# Patient Record
Sex: Female | Born: 1980 | Race: Black or African American | Hispanic: No | Marital: Single | Smoking: Former smoker
Health system: Southern US, Community
[De-identification: ages and names within clinical notes are randomized; demographics above are authoritative.]

## PROBLEM LIST (undated history)

## (undated) ENCOUNTER — Inpatient Hospital Stay (HOSPITAL_COMMUNITY): Payer: Self-pay

## (undated) DIAGNOSIS — J45909 Unspecified asthma, uncomplicated: Secondary | ICD-10-CM

## (undated) DIAGNOSIS — Z789 Other specified health status: Secondary | ICD-10-CM

## (undated) HISTORY — PX: WISDOM TOOTH EXTRACTION: SHX21

---
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
## 1999-07-19 ENCOUNTER — Emergency Department (HOSPITAL_COMMUNITY): Admission: EM | Admit: 1999-07-19 | Discharge: 1999-07-19 | Payer: Self-pay | Admitting: Emergency Medicine

## 2000-06-28 ENCOUNTER — Emergency Department (HOSPITAL_COMMUNITY): Admission: EM | Admit: 2000-06-28 | Discharge: 2000-06-28 | Payer: Self-pay | Admitting: Emergency Medicine

## 2001-02-22 ENCOUNTER — Emergency Department (HOSPITAL_COMMUNITY): Admission: EM | Admit: 2001-02-22 | Discharge: 2001-02-22 | Payer: Self-pay

## 2001-08-28 ENCOUNTER — Emergency Department (HOSPITAL_COMMUNITY): Admission: EM | Admit: 2001-08-28 | Discharge: 2001-08-28 | Payer: Self-pay

## 2002-01-06 ENCOUNTER — Inpatient Hospital Stay (HOSPITAL_COMMUNITY): Admission: AD | Admit: 2002-01-06 | Discharge: 2002-01-06 | Payer: Self-pay | Admitting: *Deleted

## 2002-01-08 ENCOUNTER — Inpatient Hospital Stay (HOSPITAL_COMMUNITY): Admission: AD | Admit: 2002-01-08 | Discharge: 2002-01-08 | Payer: Self-pay | Admitting: Obstetrics and Gynecology

## 2004-05-15 ENCOUNTER — Inpatient Hospital Stay (HOSPITAL_COMMUNITY): Admission: AD | Admit: 2004-05-15 | Discharge: 2004-05-15 | Payer: Self-pay | Admitting: *Deleted

## 2005-12-26 ENCOUNTER — Inpatient Hospital Stay (HOSPITAL_COMMUNITY): Admission: AD | Admit: 2005-12-26 | Discharge: 2005-12-26 | Payer: Self-pay | Admitting: Obstetrics and Gynecology

## 2006-08-26 ENCOUNTER — Inpatient Hospital Stay (HOSPITAL_COMMUNITY): Admission: AD | Admit: 2006-08-26 | Discharge: 2006-08-27 | Payer: Self-pay | Admitting: Gynecology

## 2006-12-19 ENCOUNTER — Inpatient Hospital Stay (HOSPITAL_COMMUNITY): Admission: AD | Admit: 2006-12-19 | Discharge: 2006-12-19 | Payer: Self-pay | Admitting: Family Medicine

## 2007-03-18 ENCOUNTER — Inpatient Hospital Stay (HOSPITAL_COMMUNITY): Admission: AD | Admit: 2007-03-18 | Discharge: 2007-03-18 | Payer: Self-pay | Admitting: Gynecology

## 2007-09-03 ENCOUNTER — Emergency Department (HOSPITAL_COMMUNITY): Admission: EM | Admit: 2007-09-03 | Discharge: 2007-09-03 | Payer: Self-pay | Admitting: Emergency Medicine

## 2007-09-13 ENCOUNTER — Emergency Department (HOSPITAL_COMMUNITY): Admission: EM | Admit: 2007-09-13 | Discharge: 2007-09-13 | Payer: Self-pay | Admitting: Emergency Medicine

## 2007-10-02 ENCOUNTER — Inpatient Hospital Stay (HOSPITAL_COMMUNITY): Admission: AD | Admit: 2007-10-02 | Discharge: 2007-10-02 | Payer: Self-pay | Admitting: Obstetrics & Gynecology

## 2007-12-12 ENCOUNTER — Encounter: Admission: RE | Admit: 2007-12-12 | Discharge: 2007-12-12 | Payer: Self-pay | Admitting: Chiropractic Medicine

## 2008-09-07 ENCOUNTER — Inpatient Hospital Stay (HOSPITAL_COMMUNITY): Admission: AD | Admit: 2008-09-07 | Discharge: 2008-09-07 | Payer: Self-pay | Admitting: Obstetrics & Gynecology

## 2008-10-26 ENCOUNTER — Emergency Department (HOSPITAL_COMMUNITY): Admission: EM | Admit: 2008-10-26 | Discharge: 2008-10-26 | Payer: Self-pay | Admitting: Emergency Medicine

## 2009-04-16 ENCOUNTER — Emergency Department (HOSPITAL_COMMUNITY): Admission: EM | Admit: 2009-04-16 | Discharge: 2009-04-16 | Payer: Self-pay | Admitting: Emergency Medicine

## 2009-04-16 ENCOUNTER — Inpatient Hospital Stay (HOSPITAL_COMMUNITY): Admission: AD | Admit: 2009-04-16 | Discharge: 2009-04-16 | Payer: Self-pay | Admitting: Obstetrics and Gynecology

## 2009-04-16 ENCOUNTER — Ambulatory Visit: Payer: Self-pay | Admitting: Obstetrics and Gynecology

## 2009-07-05 ENCOUNTER — Inpatient Hospital Stay (HOSPITAL_COMMUNITY): Admission: AD | Admit: 2009-07-05 | Discharge: 2009-07-05 | Payer: Self-pay | Admitting: Obstetrics and Gynecology

## 2010-09-05 ENCOUNTER — Inpatient Hospital Stay (HOSPITAL_COMMUNITY)
Admission: AD | Admit: 2010-09-05 | Discharge: 2010-09-06 | Disposition: A | Discharge status: Home / Self Care | Payer: Self-pay | Source: Ambulatory Visit | Attending: Obstetrics & Gynecology | Admitting: Obstetrics & Gynecology

## 2010-09-05 DIAGNOSIS — N949 Unspecified condition associated with female genital organs and menstrual cycle: Secondary | ICD-10-CM

## 2010-09-05 DIAGNOSIS — R1032 Left lower quadrant pain: Secondary | ICD-10-CM

## 2010-09-05 DIAGNOSIS — N390 Urinary tract infection, site not specified: Secondary | ICD-10-CM

## 2010-09-05 DIAGNOSIS — R102 Pelvic and perineal pain: Secondary | ICD-10-CM

## 2010-09-06 ENCOUNTER — Inpatient Hospital Stay (HOSPITAL_COMMUNITY): Payer: Self-pay

## 2010-09-06 LAB — WET PREP, GENITAL
Trich, Wet Prep: NONE SEEN
Yeast Wet Prep HPF POC: NONE SEEN

## 2010-09-06 LAB — URINALYSIS, ROUTINE W REFLEX MICROSCOPIC
Hgb urine dipstick: NEGATIVE
Ketones, ur: 15 mg/dL — AB
Nitrite: NEGATIVE
pH: 5.5 (ref 5.0–8.0)

## 2010-09-06 LAB — GC/CHLAMYDIA PROBE AMP, GENITAL: Chlamydia, DNA Probe: NEGATIVE

## 2010-09-06 LAB — POCT PREGNANCY, URINE: Preg Test, Ur: NEGATIVE

## 2010-09-06 LAB — URINE MICROSCOPIC-ADD ON

## 2010-09-08 LAB — URINE CULTURE: Colony Count: 2000

## 2010-09-18 LAB — WET PREP, GENITAL
Clue Cells Wet Prep HPF POC: NONE SEEN
Trich, Wet Prep: NONE SEEN

## 2010-09-18 LAB — URINALYSIS, ROUTINE W REFLEX MICROSCOPIC
Ketones, ur: NEGATIVE mg/dL
Leukocytes, UA: NEGATIVE
Nitrite: NEGATIVE
Protein, ur: NEGATIVE mg/dL
Urobilinogen, UA: 0.2 mg/dL (ref 0.0–1.0)

## 2010-09-18 LAB — URINE MICROSCOPIC-ADD ON

## 2010-10-06 LAB — BASIC METABOLIC PANEL
Calcium: 9.4 mg/dL (ref 8.4–10.5)
Creatinine, Ser: 0.76 mg/dL (ref 0.4–1.2)
GFR calc Af Amer: >60 mL/min (ref >60)
GFR calc non Af Amer: >60 mL/min (ref >60)
Sodium: 137 mEq/L (ref 135–145)

## 2010-10-12 LAB — POCT I-STAT, CHEM 8
Creatinine, Ser: 0.9 mg/dL (ref 0.4–1.2)
Glucose, Bld: 85 mg/dL (ref 70–99)
HCT: 47 % — ABNORMAL HIGH (ref 36.0–46.0)
Hemoglobin: 16 g/dL — ABNORMAL HIGH (ref 12.0–15.0)
Potassium: 4.1 mEq/L (ref 3.5–5.1)
Sodium: 139 mEq/L (ref 135–145)
TCO2: 24 mmol/L (ref 0–100)

## 2010-10-13 LAB — URINALYSIS, ROUTINE W REFLEX MICROSCOPIC
Bilirubin Urine: NEGATIVE
Glucose, UA: NEGATIVE mg/dL
Hgb urine dipstick: NEGATIVE
Ketones, ur: NEGATIVE mg/dL
Specific Gravity, Urine: 1.01 (ref 1.005–1.030)
pH: 6 (ref 5.0–8.0)

## 2010-10-13 LAB — WET PREP, GENITAL: Yeast Wet Prep HPF POC: NONE SEEN

## 2010-10-13 LAB — GC/CHLAMYDIA PROBE AMP, GENITAL: Chlamydia, DNA Probe: NEGATIVE

## 2010-10-19 IMAGING — US US TRANSVAGINAL NON-OB
1 series · 14 of 25 positions shown · non-contrast
Comparison: 12/19/2006.

CLINICAL DATA: Left lower quadrant pain.  Vaginal discharge.

TRANSABDOMINAL AND TRANSVAGINAL ULTRASOUND OF PELVIS
TECHNIQUE: Both transabdominal and transvaginal ultrasound
examinations of the pelvis were performed including evaluation of
the uterus, ovaries, adnexal regions, and pelvic cul-de-sac.

[Series 1: us transvaginal non-ob · 14 of 48 slices shown]
[im 1/48]
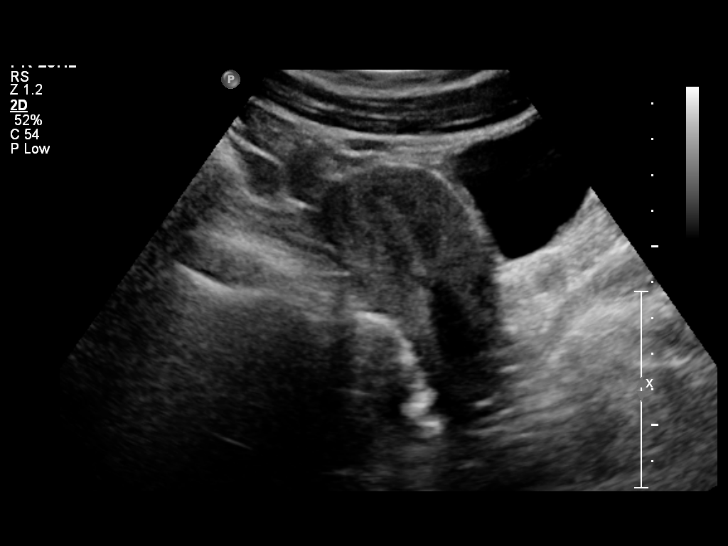
[im 4/48]
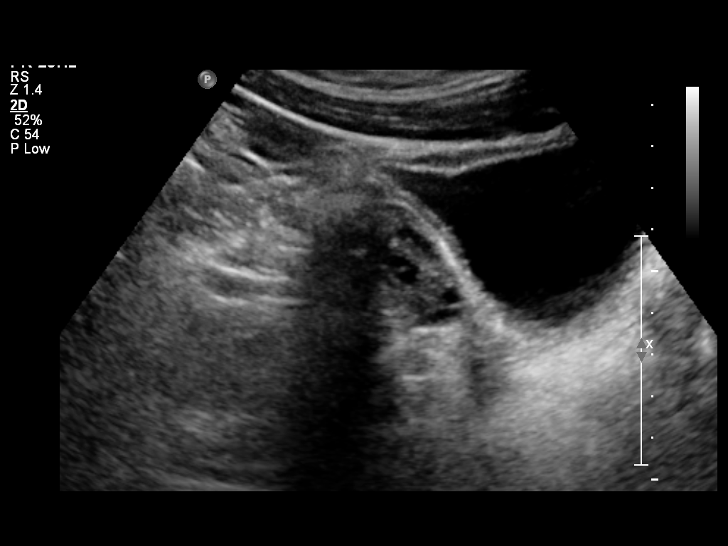
[im 8/48]
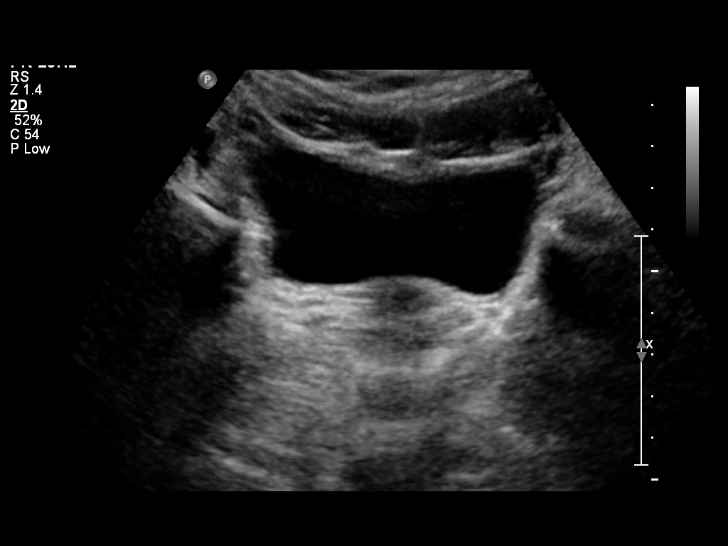
[im 12/48]
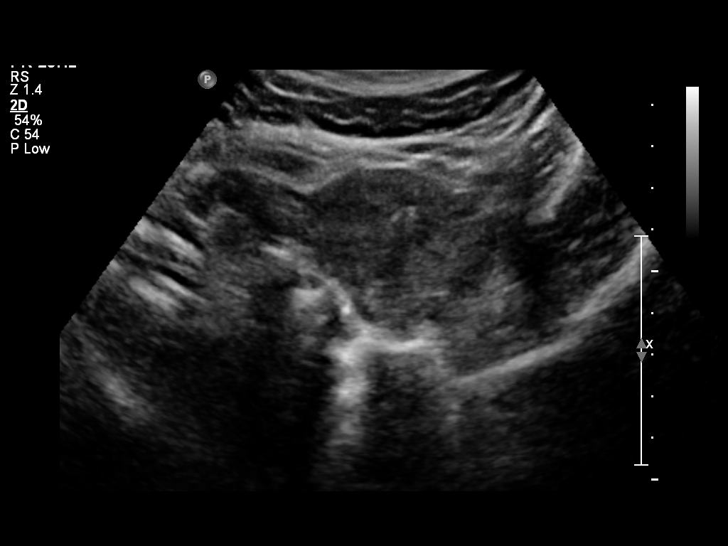
[im 16/48]
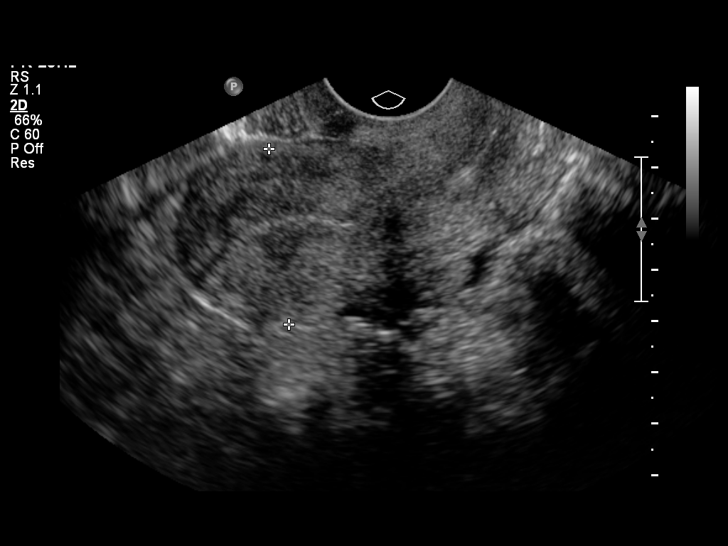
[im 18/48]
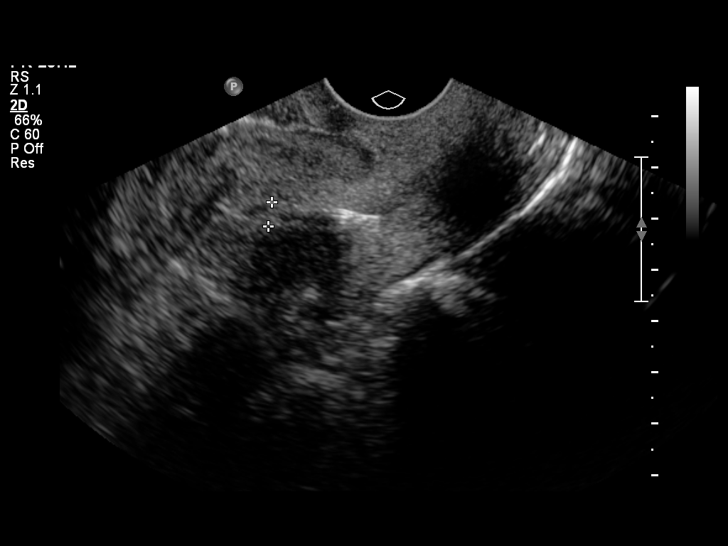
[im 22/48]
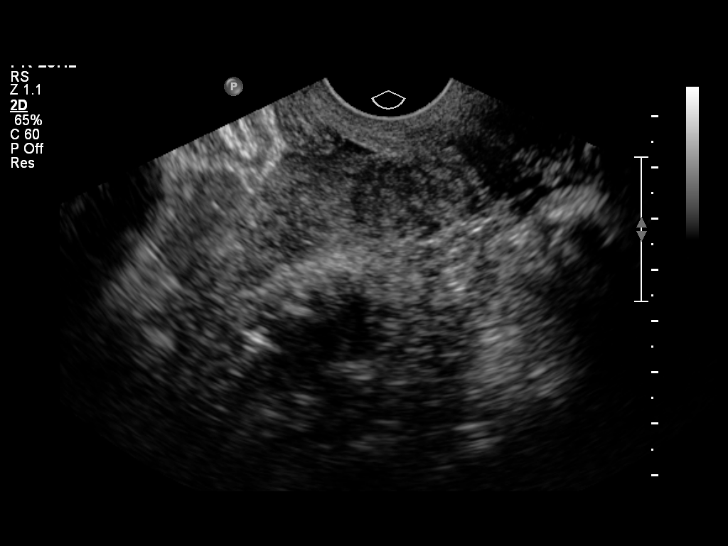
[im 26/48]
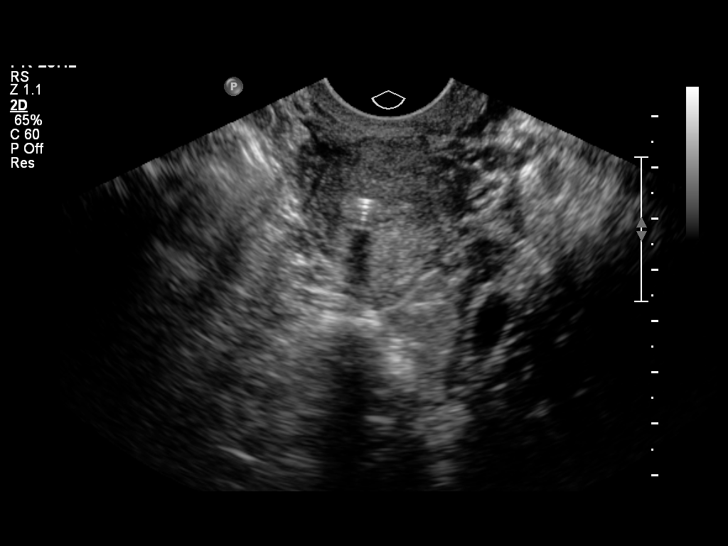
[im 30/48]
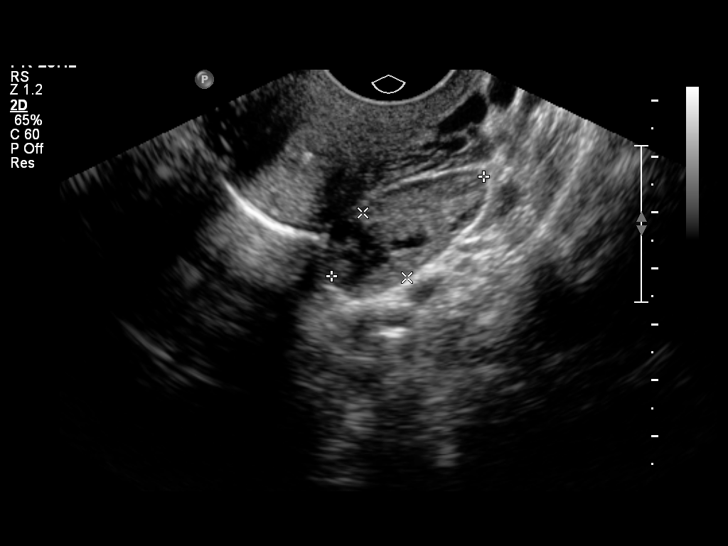
[im 32/48]
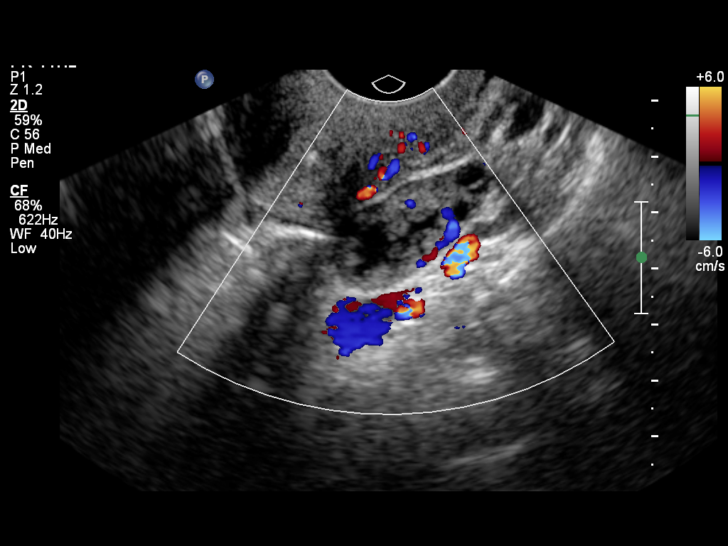
[im 36/48]
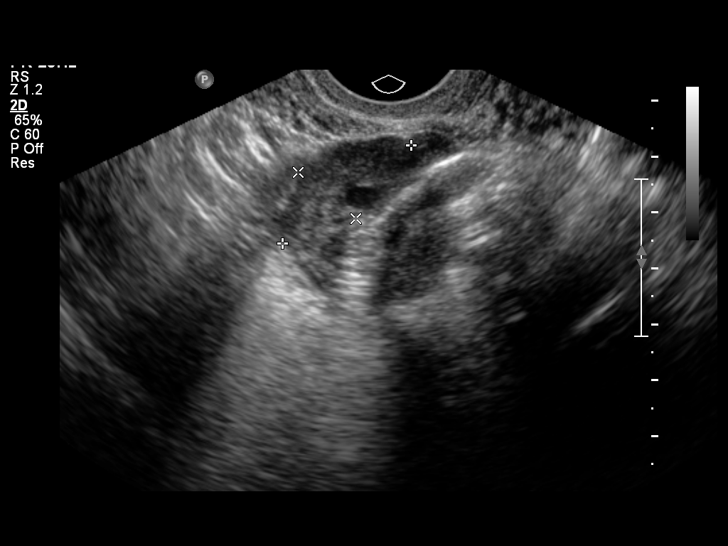
[im 40/48]
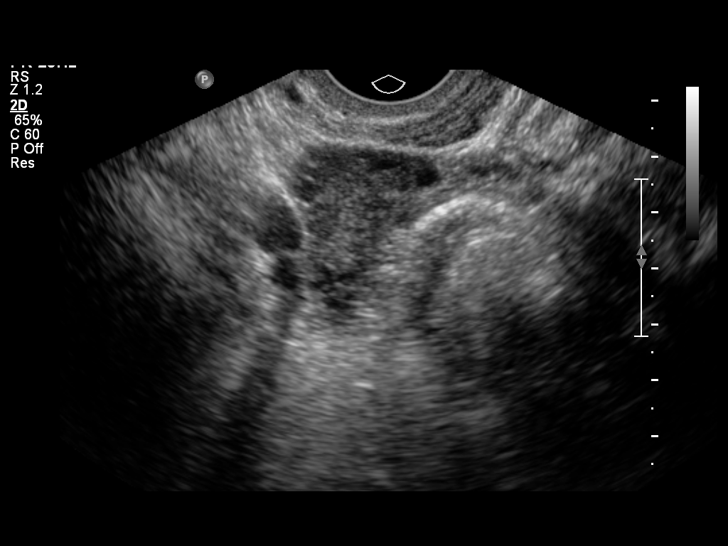
[im 44/48]
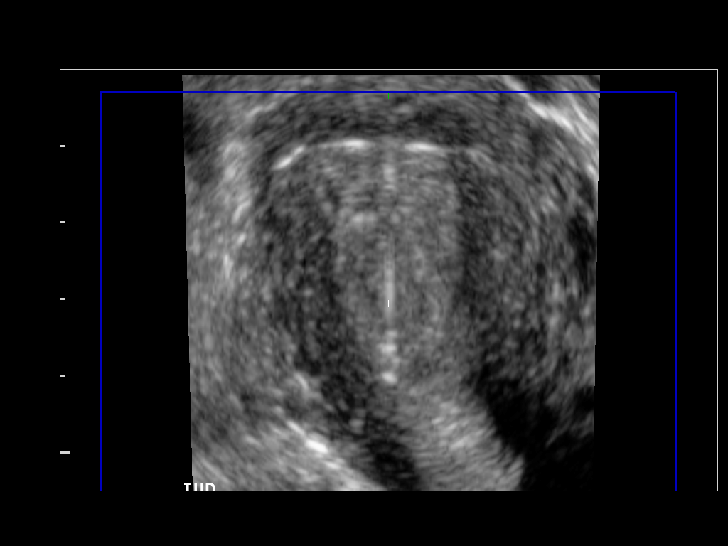
[im 48/48]
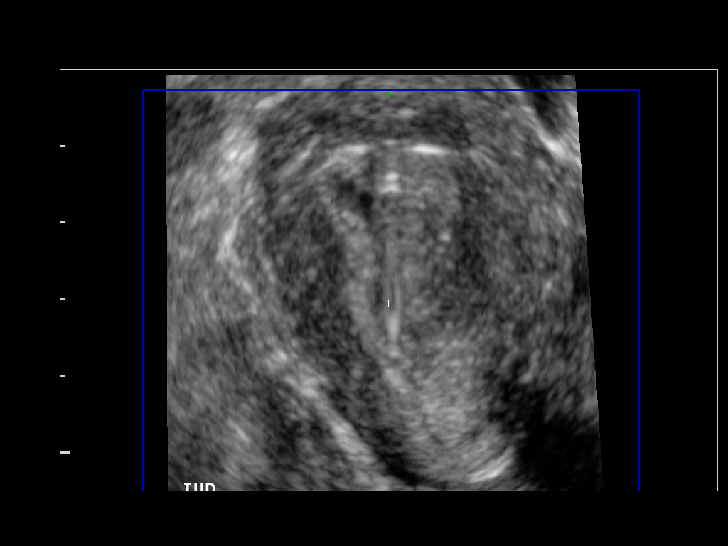

[14 of 25 positions shown; findings below may reference images not displayed]

FINDINGS: Uterus:  Normal in size at 7.8 x 3.4 x 4.1 cm.

     Endometrium:  Normal in thickness at 5 mm.  Intrauterine
device appropriately positioned.

     Right Ovary:  2.9 x 1.3 x 1.5 cm.  Normal in morphology.

     Left Ovary:  3.2 x 1.4 x 1.7 cm.  1.2 cm irregular hypoechoic
lesion within is likely a corpus luteal cyst / involuting follicle.

No significant free fluid.
IMPRESSION: 1.  Appropriate position of intrauterine device.
2.  Left ovarian corpus luteal cyst / involuting follicle.

## 2011-01-13 ENCOUNTER — Emergency Department (HOSPITAL_COMMUNITY)
Admission: EM | Admit: 2011-01-13 | Discharge: 2011-01-13 | Disposition: A | Discharge status: Home / Self Care | Payer: Self-pay | Attending: Emergency Medicine | Admitting: Emergency Medicine

## 2011-01-13 DIAGNOSIS — J3489 Other specified disorders of nose and nasal sinuses: Secondary | ICD-10-CM | POA: Insufficient documentation

## 2011-01-13 DIAGNOSIS — R6883 Chills (without fever): Secondary | ICD-10-CM | POA: Insufficient documentation

## 2011-01-13 DIAGNOSIS — H6690 Otitis media, unspecified, unspecified ear: Secondary | ICD-10-CM | POA: Insufficient documentation

## 2011-01-13 DIAGNOSIS — J45909 Unspecified asthma, uncomplicated: Secondary | ICD-10-CM | POA: Insufficient documentation

## 2011-01-13 DIAGNOSIS — H9209 Otalgia, unspecified ear: Secondary | ICD-10-CM | POA: Insufficient documentation

## 2011-01-13 DIAGNOSIS — I10 Essential (primary) hypertension: Secondary | ICD-10-CM | POA: Insufficient documentation

## 2011-03-28 LAB — URINALYSIS, ROUTINE W REFLEX MICROSCOPIC
Hgb urine dipstick: NEGATIVE
Nitrite: NEGATIVE
Protein, ur: NEGATIVE
Specific Gravity, Urine: 1.015
Urobilinogen, UA: 0.2

## 2011-03-28 LAB — POCT PREGNANCY, URINE
Operator id: 12087
Preg Test, Ur: NEGATIVE

## 2011-03-28 LAB — GC/CHLAMYDIA PROBE AMP, GENITAL
Chlamydia, DNA Probe: NEGATIVE
GC Probe Amp, Genital: NEGATIVE

## 2011-03-28 LAB — WET PREP, GENITAL: Clue Cells Wet Prep HPF POC: NONE SEEN

## 2011-04-13 LAB — URINALYSIS, ROUTINE W REFLEX MICROSCOPIC
Bilirubin Urine: NEGATIVE
Hgb urine dipstick: NEGATIVE
Nitrite: NEGATIVE
Specific Gravity, Urine: 1.025
Urobilinogen, UA: 0.2
pH: 6

## 2011-04-13 LAB — GC/CHLAMYDIA PROBE AMP, GENITAL: GC Probe Amp, Genital: NEGATIVE

## 2011-04-13 LAB — POCT PREGNANCY, URINE
Operator id: 120561
Preg Test, Ur: NEGATIVE

## 2011-04-13 LAB — CBC
HCT: 42.4
MCHC: 34.1
MCV: 90.7
RBC: 4.67
WBC: 9.9

## 2011-04-19 LAB — URINALYSIS, ROUTINE W REFLEX MICROSCOPIC
Bilirubin Urine: NEGATIVE
Glucose, UA: NEGATIVE
Hgb urine dipstick: NEGATIVE
Ketones, ur: NEGATIVE
Protein, ur: NEGATIVE
pH: 6

## 2011-04-19 LAB — WET PREP, GENITAL
Clue Cells Wet Prep HPF POC: NONE SEEN
Trich, Wet Prep: NONE SEEN
Yeast Wet Prep HPF POC: NONE SEEN

## 2011-04-19 LAB — GC/CHLAMYDIA PROBE AMP, GENITAL
Chlamydia, DNA Probe: NEGATIVE
GC Probe Amp, Genital: NEGATIVE

## 2016-01-09 ENCOUNTER — Encounter (HOSPITAL_COMMUNITY): Payer: Self-pay | Admitting: *Deleted

## 2016-01-09 ENCOUNTER — Inpatient Hospital Stay (HOSPITAL_COMMUNITY)
Admission: AD | Admit: 2016-01-09 | Discharge: 2016-01-09 | Disposition: A | Discharge status: Home / Self Care | Payer: Medicaid Other | Source: Ambulatory Visit | Attending: Obstetrics & Gynecology | Admitting: Obstetrics & Gynecology

## 2016-01-09 ENCOUNTER — Inpatient Hospital Stay (HOSPITAL_COMMUNITY): Payer: Medicaid Other

## 2016-01-09 DIAGNOSIS — Z87891 Personal history of nicotine dependence: Secondary | ICD-10-CM | POA: Diagnosis not present

## 2016-01-09 DIAGNOSIS — Z880 Allergy status to penicillin: Secondary | ICD-10-CM | POA: Insufficient documentation

## 2016-01-09 DIAGNOSIS — O9989 Other specified diseases and conditions complicating pregnancy, childbirth and the puerperium: Secondary | ICD-10-CM

## 2016-01-09 DIAGNOSIS — R109 Unspecified abdominal pain: Secondary | ICD-10-CM | POA: Diagnosis not present

## 2016-01-09 DIAGNOSIS — O26891 Other specified pregnancy related conditions, first trimester: Secondary | ICD-10-CM | POA: Diagnosis not present

## 2016-01-09 DIAGNOSIS — O3680X0 Pregnancy with inconclusive fetal viability, not applicable or unspecified: Secondary | ICD-10-CM

## 2016-01-09 DIAGNOSIS — O26899 Other specified pregnancy related conditions, unspecified trimester: Secondary | ICD-10-CM

## 2016-01-09 DIAGNOSIS — Z3A01 Less than 8 weeks gestation of pregnancy: Secondary | ICD-10-CM | POA: Diagnosis not present

## 2016-01-09 HISTORY — DX: Other specified health status: Z78.9

## 2016-01-09 LAB — CBC
HEMATOCRIT: 39 % (ref 36.0–46.0)
Hemoglobin: 13 g/dL (ref 12.0–15.0)
MCH: 29 pg (ref 26.0–34.0)
MCHC: 33.3 g/dL (ref 30.0–36.0)
MCV: 87.1 fL (ref 78.0–100.0)
PLATELETS: 326 10*3/uL (ref 150–400)
RBC: 4.48 MIL/uL (ref 3.87–5.11)
RDW: 14.3 % (ref 11.5–15.5)
WBC: 8.6 10*3/uL (ref 4.0–10.5)

## 2016-01-09 LAB — URINALYSIS, ROUTINE W REFLEX MICROSCOPIC
BILIRUBIN URINE: NEGATIVE
Glucose, UA: NEGATIVE mg/dL
HGB URINE DIPSTICK: NEGATIVE
KETONES UR: NEGATIVE mg/dL
Leukocytes, UA: NEGATIVE
Nitrite: NEGATIVE
PROTEIN: NEGATIVE mg/dL
SPECIFIC GRAVITY, URINE: 1.02 (ref 1.005–1.030)
pH: 7.5 (ref 5.0–8.0)

## 2016-01-09 LAB — HCG, QUANTITATIVE, PREGNANCY: HCG, BETA CHAIN, QUANT, S: 1820 m[IU]/mL — AB (ref <5)

## 2016-01-09 LAB — ABO/RH: ABO/RH(D): AB POS

## 2016-01-09 LAB — POCT PREGNANCY, URINE: PREG TEST UR: POSITIVE — AB

## 2016-01-09 NOTE — MAU Provider Note (Signed)
History     CSN: [id number]  Arrival date and time: 01/09/16 1313   First Provider Initiated Contact with Patient 01/09/16 1414      Chief Complaint  Patient presents with  . Abdominal Pain   HPI   Ms.Kristina Santiago is 35 y.o. female 763P1010 @ 569w4d presenting to MAU with abdominal pain. She did not know she was pregnant. The pain started 1-2 weeks ago, "the pain had me balled up". She tried tylenol which helped some. She is not currently having pain,  but did feel the off and on "pinches" this morning. The pain comes every day, however the pain comes and goes.   She denies vaginal bleeding.  This is a desired pregnancy.   OB History    Gravida Para Term Preterm AB TAB SAB Ectopic Multiple Living   3 1 1  1  1          Past Medical History  Diagnosis Date  . Medical history non-contributory     Past Surgical History  Procedure Laterality Date  . No past surgeries      History reviewed. No pertinent family history.  Social History  Substance Use Topics  . Smoking status: Former Games developermoker  . Smokeless tobacco: None  . Alcohol Use: Yes    Allergies:  Allergies  Allergen Reactions  . Penicillins Anaphylaxis    Has patient had a PCN reaction causing immediate rash, facial/tongue/throat swelling, SOB or lightheadedness with hypotension: Yes Has patient had a PCN reaction causing severe rash involving mucus membranes or skin necrosis: No Has patient had a PCN reaction that required hospitalization No Has patient had a PCN reaction occurring within the last 10 years: No If all of the above answers are "NO", then may proceed with Cephalosporin use.     Prescriptions prior to admission  Medication Sig Dispense Refill Last Dose  . acetaminophen (TYLENOL) 325 MG tablet Take 650 mg by mouth every 6 (six) hours as needed for moderate pain.   Past Week at Unknown time  . Multiple Vitamins-Minerals (HAIR SKIN NAILS PO) Take 1 tablet by mouth daily.      Results for  orders placed or performed during the hospital encounter of 01/09/16 (from the past 48 hour(s))  Urinalysis, Routine w reflex microscopic (not at [facility])     Status: None   Collection Time: 01/09/16  1:30 PM  Result Value Ref Range   Color, Urine YELLOW YELLOW   APPearance CLEAR CLEAR   Specific Gravity, Urine 1.020 1.005 - 1.030   pH 7.5 5.0 - 8.0   Glucose, UA NEGATIVE NEGATIVE mg/dL   Hgb urine dipstick NEGATIVE NEGATIVE   Bilirubin Urine NEGATIVE NEGATIVE   Ketones, ur NEGATIVE NEGATIVE mg/dL   Protein, ur NEGATIVE NEGATIVE mg/dL   Nitrite NEGATIVE NEGATIVE   Leukocytes, UA NEGATIVE NEGATIVE    Comment: MICROSCOPIC NOT DONE ON URINES WITH NEGATIVE PROTEIN, BLOOD, LEUKOCYTES, NITRITE, OR GLUCOSE <1000 mg/dL.  Pregnancy, urine POC     Status: Abnormal   Collection Time: 01/09/16  1:36 PM  Result Value Ref Range   Preg Test, Ur POSITIVE (A) NEGATIVE    Comment:        THE SENSITIVITY OF THIS METHODOLOGY IS >24 mIU/mL   CBC     Status: None   Collection Time: 01/09/16  2:43 PM  Result Value Ref Range   WBC 8.6 4.0 - 10.5 K/uL   RBC 4.48 3.87 - 5.11 MIL/uL   Hemoglobin 13.0 12.0 - 15.0  g/dL   HCT 16.1 09.6 - 04.5 %   MCV 87.1 78.0 - 100.0 fL   MCH 29.0 26.0 - 34.0 pg   MCHC 33.3 30.0 - 36.0 g/dL   RDW 40.9 81.1 - 91.4 %   Platelets 326 150 - 400 K/uL  ABO/Rh     Status: None (Preliminary result)   Collection Time: 01/09/16  2:43 PM  Result Value Ref Range   ABO/RH(D) AB POS   hCG, quantitative, pregnancy     Status: Abnormal   Collection Time: 01/09/16  2:43 PM  Result Value Ref Range   hCG, Beta Chain, Quant, S 1820 (H) <5 mIU/mL    Comment:          GEST. AGE      CONC.  (mIU/mL)   <=1 WEEK        5 - 50     2 WEEKS       50 - 500     3 WEEKS       100 - 10,000     4 WEEKS     1,000 - 30,000     5 WEEKS     3,500 - 115,000   6-8 WEEKS     12,000 - 270,000    12 WEEKS     15,000 - 220,000        FEMALE AND NON-PREGNANT FEMALE:     LESS THAN 5 mIU/mL     Review  of Systems  Constitutional: Negative for fever and chills.  Genitourinary: Negative for dysuria.       Denies abnormal vaginal discharge.    Physical Exam   Blood pressure 131/86, pulse 89, temperature 98.1 F (36.7 C), temperature source Oral, resp. rate 18, height 5' (1.524 m), weight 150 lb 3.2 oz (68.13 kg), last menstrual period 12/08/2015.  Physical Exam  Constitutional: She is oriented to person, place, and time. She appears well-developed and well-nourished. No distress.  HENT:  Head: Normocephalic.  Eyes: Pupils are equal, round, and reactive to light.  Neck: Neck supple.  GI: Soft. She exhibits no distension and no mass. There is no tenderness. There is no rebound and no guarding.  Genitourinary:  Bimanual exam: Cervix closed Uterus non tender, normal size Adnexa non tender, no masses bilaterally Chaperone present for exam.  Musculoskeletal: Normal range of motion.  Neurological: She is alert and oriented to person, place, and time.  Skin: Skin is warm. She is not diaphoretic.  Psychiatric: Her behavior is normal.    MAU Course  Procedures  None  MDM  CBC Quant [place] ABO UA  Assessment and Plan    A:  1. Pregnancy of unknown anatomic location   2. Abdominal pain in pregnancy, antepartum     P:  Discharge home in stable condition Return to the [facility] on Wednesday @ 11:00 for repeat Hcg (patient unable to go on Tuesday) Ectopic precautions First trimester warning signs discussed Return to MAU if symptoms worsen  Pelvic rest    Duane Lope, NP 01/09/2016 4:14 PM

## 2016-01-09 NOTE — Discharge Instructions (Signed)

## 2016-01-09 NOTE — MAU Note (Signed)
Thinks might be pregnant, missed period, LMP 12/08/15, abdominal pain about 1 weeks ago, now feels sharp shooting pains

## 2016-01-11 ENCOUNTER — Encounter (HOSPITAL_COMMUNITY): Payer: Self-pay

## 2016-01-11 ENCOUNTER — Other Ambulatory Visit: Payer: Self-pay

## 2016-01-11 ENCOUNTER — Inpatient Hospital Stay (HOSPITAL_COMMUNITY)
Admission: AD | Admit: 2016-01-11 | Discharge: 2016-01-11 | Disposition: A | Discharge status: Home / Self Care | Payer: Medicaid Other | Source: Ambulatory Visit | Attending: Obstetrics & Gynecology | Admitting: Obstetrics & Gynecology

## 2016-01-11 DIAGNOSIS — R109 Unspecified abdominal pain: Secondary | ICD-10-CM | POA: Diagnosis not present

## 2016-01-11 DIAGNOSIS — O9989 Other specified diseases and conditions complicating pregnancy, childbirth and the puerperium: Secondary | ICD-10-CM

## 2016-01-11 DIAGNOSIS — O26899 Other specified pregnancy related conditions, unspecified trimester: Secondary | ICD-10-CM

## 2016-01-11 DIAGNOSIS — Z87891 Personal history of nicotine dependence: Secondary | ICD-10-CM | POA: Insufficient documentation

## 2016-01-11 DIAGNOSIS — O26891 Other specified pregnancy related conditions, first trimester: Secondary | ICD-10-CM | POA: Insufficient documentation

## 2016-01-11 DIAGNOSIS — O3680X0 Pregnancy with inconclusive fetal viability, not applicable or unspecified: Secondary | ICD-10-CM | POA: Diagnosis not present

## 2016-01-11 DIAGNOSIS — Z880 Allergy status to penicillin: Secondary | ICD-10-CM | POA: Diagnosis not present

## 2016-01-11 DIAGNOSIS — O0281 Inappropriate change in quantitative human chorionic gonadotropin (hCG) in early pregnancy: Secondary | ICD-10-CM | POA: Insufficient documentation

## 2016-01-11 DIAGNOSIS — F1010 Alcohol abuse, uncomplicated: Secondary | ICD-10-CM | POA: Insufficient documentation

## 2016-01-11 DIAGNOSIS — Z3A01 Less than 8 weeks gestation of pregnancy: Secondary | ICD-10-CM | POA: Diagnosis not present

## 2016-01-11 LAB — HCG, QUANTITATIVE, PREGNANCY: HCG, BETA CHAIN, QUANT, S: 3508 m[IU]/mL — AB (ref <5)

## 2016-01-11 NOTE — MAU Note (Signed)
"  same thing", keeps having the abd pain, like little pinches, more on left side.  Feels like it is getting worse. Having  "dropping feeling" in suprapubic area, not painful- but is like when she has menstrual.  No bleeding.

## 2016-01-11 NOTE — MAU Note (Signed)
Not in lobby

## 2016-01-11 NOTE — Discharge Instructions (Signed)
Pelvic Rest °Pelvic rest is sometimes recommended for women when:  °· The placenta is partially or completely covering the opening of the cervix (placenta previa). °· There is bleeding between the uterine wall and the amniotic sac in the first trimester (subchorionic hemorrhage). °· The cervix begins to open without labor starting (incompetent cervix, cervical insufficiency). °· The labor is too early (preterm labor). °HOME CARE INSTRUCTIONS °· Do not have sexual intercourse, stimulation, or an orgasm. °· Do not use tampons, douche, or put anything in the vagina. °· Do not lift anything over 10 pounds (4.5 kg). °· Avoid strenuous activity or straining your pelvic muscles. °SEEK MEDICAL CARE IF:  °· You have any vaginal bleeding during pregnancy. Treat this as a potential emergency. °· You have cramping pain felt low in the stomach (stronger than menstrual cramps). °· You notice vaginal discharge (watery, mucus, or bloody). °· You have a low, dull backache. °· There are regular contractions or uterine tightening. °SEEK IMMEDIATE MEDICAL CARE IF: °You have vaginal bleeding and have placenta previa.  °  °This information is not intended to replace advice given to you by your health care provider. Make sure you discuss any questions you have with your health care provider. °  °Document Released: 10/14/2010 Document Revised: 09/11/2011 Document Reviewed: 12/21/2014 °Elsevier Interactive Patient Education ©2016 [employer]vier Inc. ° °

## 2016-01-11 NOTE — MAU Provider Note (Signed)
History     CSN: [id number]  Arrival date and time: 01/11/16 5784   First Provider Initiated Contact with Patient 01/11/16 1154      Chief Complaint  Patient presents with  . Abdominal Pain   HPI   Ms. Kristina Santiago is a 35 y.o. female G33P1010 @ [redacted]w[redacted]d here with abdominal pain. She denies bleeding. She was seen 2 days ago in the MAU for the same complaint. The pain is the same pain she was having 2 days ago; however the patient is very anxious about the pain. She was originally scheduled to go to the [facility] tomorrow at 11:00 however the patient refused to go down to the clinic for her follow up Quant, she demanded to be seen today for her quant.   Patient currently rates her pain 0/10  OB History    Gravida Para Term Preterm AB TAB SAB Ectopic Multiple Living   3 1 1  1  1          Past Medical History  Diagnosis Date  . Medical history non-contributory     Past Surgical History  Procedure Laterality Date  . No past surgeries    . Wisdom tooth extraction      No family history on file.  Social History  Substance Use Topics  . Smoking status: Former Games developer  . Smokeless tobacco: None  . Alcohol Use: Yes    Allergies:  Allergies  Allergen Reactions  . Penicillins Anaphylaxis    Has patient had a PCN reaction causing immediate rash, facial/tongue/throat swelling, SOB or lightheadedness with hypotension: Yes Has patient had a PCN reaction causing severe rash involving mucus membranes or skin necrosis: No Has patient had a PCN reaction that required hospitalization No Has patient had a PCN reaction occurring within the last 10 years: No If all of the above answers are "NO", then may proceed with Cephalosporin use.     Prescriptions prior to admission  Medication Sig Dispense Refill Last Dose  . acetaminophen (TYLENOL) 500 MG tablet Take 1,000 mg by mouth every 6 (six) hours as needed for mild pain.   Past Week at Unknown time  . DM-Doxylamine-Acetaminophen  30-12.10-998 MG/30ML LIQD Take 30 mLs by mouth at bedtime as needed (sleep).   Past Week at Unknown time  . Multiple Vitamins-Minerals (HAIR SKIN NAILS PO) Take 1 tablet by mouth daily.   01/10/2016 at Unknown time   Results for orders placed or performed during the hospital encounter of 01/11/16 (from the past 48 hour(s))  hCG, quantitative, pregnancy     Status: Abnormal   Collection Time: 01/11/16 10:12 AM  Result Value Ref Range   hCG, Beta Chain, Quant, S 3508 (H) <5 mIU/mL    Comment:          GEST. AGE      CONC.  (mIU/mL)   <=1 WEEK        5 - 50     2 WEEKS       50 - 500     3 WEEKS       100 - 10,000     4 WEEKS     1,000 - 30,000     5 WEEKS     3,500 - 115,000   6-8 WEEKS     12,000 - 270,000    12 WEEKS     15,000 - 220,000        FEMALE AND NON-PREGNANT FEMALE:     LESS THAN 5  mIU/mL    Results for orders placed or performed during the hospital encounter of 01/11/16 (from the past 48 hour(s))  hCG, quantitative, pregnancy     Status: Abnormal   Collection Time: 01/11/16 10:12 AM  Result Value Ref Range   hCG, Beta Chain, Quant, S 3508 (H) <5 mIU/mL    Comment:          GEST. AGE      CONC.  (mIU/mL)   <=1 WEEK        5 - 50     2 WEEKS       50 - 500     3 WEEKS       100 - 10,000     4 WEEKS     1,000 - 30,000     5 WEEKS     3,500 - 115,000   6-8 WEEKS     12,000 - 270,000    12 WEEKS     15,000 - 220,000        FEMALE AND NON-PREGNANT FEMALE:     LESS THAN 5 mIU/mL    US Ob Comp Less 14 Wks  01/09/2016  CLINICAL DATA:  35 year old female pregnant patient with abdominal and pelvic pain. EXAM: OBSTETRIC <14 WK [place] AND TRANSVAGINAL OB US TECHNIQUE: Both transabdominal and transvaginal ultrasound examinations were performed for complete evaluation of the gestation as well as the maternal uterus, adnexal regions, and pelvic cul-de-sac. Transvaginal technique was performed to assess early pregnancy. COMPARISON:  Pelvic ultrasound 09/06/2010. FINDINGS: Intrauterine  gestational sac: Single ovoid shaped gestational sac in the fundal portion of the endometrial canal. Yolk sac:  None. Embryo:  None. Cardiac Activity: None. Heart Rate: N/A MSD: 3.1  mm   5 w   0  d       [place] EDC: N/A Subchorionic hemorrhage:  None visualized. Maternal uterus/adnexae: Degenerating corpus luteum cyst in the left ovary incidentally noted. Right ovary is normal in appearance. No significant volume of free fluid in the cul-de-sac. IMPRESSION: 1. Probable early intrauterine gestational sac, but no yolk sac, fetal pole, or cardiac activity yet visualized. Recommend follow-up quantitative B-HCG levels and follow-up US in 14 days to confirm and assess viability. This recommendation follows SRU consensus guidelines: Diagnostic Criteria for Nonviable Pregnancy Early in the First Trimester. Malva Limes Med 2013; 161:0960-45. 2. No acute findings. Electronically Signed   By: Trudie Reed M.D.   On: 01/09/2016 15:30   US Ob Transvaginal  01/09/2016  CLINICAL DATA:  35 year old female pregnant patient with abdominal and pelvic pain. EXAM: OBSTETRIC <14 WK [place] AND TRANSVAGINAL OB US TECHNIQUE: Both transabdominal and transvaginal ultrasound examinations were performed for complete evaluation of the gestation as well as the maternal uterus, adnexal regions, and pelvic cul-de-sac. Transvaginal technique was performed to assess early pregnancy. COMPARISON:  Pelvic ultrasound 09/06/2010. FINDINGS: Intrauterine gestational sac: Single ovoid shaped gestational sac in the fundal portion of the endometrial canal. Yolk sac:  None. Embryo:  None. Cardiac Activity: None. Heart Rate: N/A MSD: 3.1  mm   5 w   0  d       [place] EDC: N/A Subchorionic hemorrhage:  None visualized. Maternal uterus/adnexae: Degenerating corpus luteum cyst in the left ovary incidentally noted. Right ovary is normal in appearance. No significant volume of free fluid in the cul-de-sac. IMPRESSION: 1. Probable early intrauterine gestational sac, but no  yolk sac, fetal pole, or cardiac activity yet visualized. Recommend follow-up quantitative B-HCG levels and follow-up US in  14 days to confirm and assess viability. This recommendation follows SRU consensus guidelines: Diagnostic Criteria for Nonviable Pregnancy Early in the First Trimester. Malva Limes Engl J Med 2013; 161:0960-45; 369:1443-51. 2. No acute findings. Electronically Signed   By: Trudie Reedaniel  Entrikin M.D.   On: 01/09/2016 15:30    Review of Systems  Constitutional: Negative for fever and chills.  Gastrointestinal: Positive for abdominal pain. Negative for nausea and vomiting.  Genitourinary: Negative for dysuria and urgency.   Physical Exam   Blood pressure 120/59, pulse 74, temperature 98.7 F (37.1 C), temperature source Oral, resp. rate 18, weight 150 lb 12.8 oz (68.402 kg), last menstrual period 12/08/2015.  Physical Exam  Constitutional: She is oriented to person, place, and time. She appears well-developed and well-nourished. No distress.  Respiratory: Effort normal.  GI: Soft. She exhibits no distension and no mass. There is no tenderness. There is no rebound and no guarding.  Musculoskeletal: Normal range of motion.  Neurological: She is alert and oriented to person, place, and time.  Skin: Skin is warm. She is not diaphoretic.  Psychiatric: Her behavior is normal.    MAU Course  Procedures  None  MDM  Quant 7/9: 1820 Quant 7/11: 4098: 3508; Quant nearly doubled in less than 48 hours. Patient without pain at this time.   Assessment and Plan   A:  1. Abdominal pain in pregnancy   2. Pregnancy of unknown anatomic location   3. Elevated level of quantitative hCG for gestational age in early pregnancy     P:  Discharge home in stable condition  Strict ectopic precautions Pelvic rest [facility] symptoms wor[facility] Follow up US in 7 days- [facility] for follow up   Duane LopeJennifer I Rasch, NP 01/11/2016 11:56 AM

## 2016-01-19 ENCOUNTER — Encounter: Payer: Self-pay | Admitting: Obstetrics & Gynecology

## 2016-01-19 ENCOUNTER — Ambulatory Visit (INDEPENDENT_AMBULATORY_CARE_PROVIDER_SITE_OTHER): Payer: Self-pay | Admitting: Obstetrics & Gynecology

## 2016-01-19 ENCOUNTER — Ambulatory Visit (HOSPITAL_COMMUNITY)
Admission: RE | Admit: 2016-01-19 | Discharge: 2016-01-19 | Disposition: A | Discharge status: Home / Self Care | Payer: Medicaid Other | Source: Ambulatory Visit | Attending: Obstetrics and Gynecology | Admitting: Obstetrics and Gynecology

## 2016-01-19 DIAGNOSIS — Z36 Encounter for antenatal screening of mother: Secondary | ICD-10-CM | POA: Diagnosis present

## 2016-01-19 DIAGNOSIS — Z3491 Encounter for supervision of normal pregnancy, unspecified, first trimester: Secondary | ICD-10-CM

## 2016-01-19 DIAGNOSIS — N83202 Unspecified ovarian cyst, left side: Secondary | ICD-10-CM | POA: Diagnosis not present

## 2016-01-19 DIAGNOSIS — Z3A01 Less than 8 weeks gestation of pregnancy: Secondary | ICD-10-CM | POA: Diagnosis not present

## 2016-01-19 DIAGNOSIS — O26891 Other specified pregnancy related conditions, first trimester: Secondary | ICD-10-CM | POA: Diagnosis present

## 2016-01-19 DIAGNOSIS — O3481 Maternal care for other abnormalities of pelvic organs, first trimester: Secondary | ICD-10-CM | POA: Insufficient documentation

## 2016-01-19 DIAGNOSIS — R109 Unspecified abdominal pain: Secondary | ICD-10-CM | POA: Insufficient documentation

## 2016-01-19 NOTE — Progress Notes (Signed)
Ultrasounds Results Note  SUBJECTIVE HPI:  Ms. Kristina Santiago is a 35 y.o. G3P1011 at 5129w0d by LMP who [facility] followup ultrasound results. The patient denies abdominal pain or vaginal bleeding.  Upon review of the patient's records, patient was first seen in MAU on 01/09/2016 for pain.  Ultrasound showed IUGS.  Repeat ultrasound was performed earlier today.   Past Medical History  Diagnosis Date  . Medical history non-contributory    Past Surgical History  Procedure Laterality Date  . Wisdom tooth extraction     Social History   Social History  . Marital Status: Single    Spouse Name: N/A  . Number of Children: N/A  . Years of Education: N/A   Occupational History  . Not on file.   Social History Main Topics  . Smoking status: Former Games developermoker  . Smokeless tobacco: Not on file  . Alcohol Use: Yes  . Drug Use: No  . Sexual Activity: Not on file   Other Topics Concern  . Not on file   Social History Narrative   Current Outpatient Prescriptions on File Prior to Visit  Medication Sig Dispense Refill  . acetaminophen (TYLENOL) 500 MG tablet Take 1,000 mg by mouth every 6 (six) hours as needed for mild pain.    . Multiple Vitamins-Minerals (HAIR SKIN NAILS PO) Take 1 tablet by mouth daily.     No current facility-administered medications on file prior to visit.   Allergies  Allergen Reactions  . Penicillins Anaphylaxis    Has patient had a PCN reaction causing immediate rash, facial/tongue/throat swelling, SOB or lightheadedness with hypotension: Yes Has patient had a PCN reaction causing severe rash involving mucus membranes or skin necrosis: No Has patient had a PCN reaction that required hospitalization No Has patient had a PCN reaction occurring within the last 10 years: No If all of the above answers are "NO", then may proceed with Cephalosporin use.     I have reviewed patient's Past Medical Hx, Surgical Hx, Family Hx, Social  Hx, medications and allergies.   Review of Systems Review of Systems  Constitutional: Negative for fever and chills.  Gastrointestinal: Negative for nausea, vomiting, abdominal pain, diarrhea and constipation.  Genitourinary: Negative for dysuria.  Musculoskeletal: Negative for back pain.  Neurological: Negative for dizziness and weakness.    Physical Exam  LMP 12/08/2015  GENERAL: Well-developed, well-nourished female in no acute distress.  HEENT: Normocephalic, atraumatic.   LUNGS: Effort normal ABDOMEN: soft, non-tender HEART: Regular rate  SKIN: Warm, dry and without erythema PSYCH: Normal mood and affect NEURO: Alert and oriented x 4  LAB RESULTS No results found for this or any previous visit (from the past 24 hour(s)).  IMAGING [place] Ob Comp Less 14 Wks  01/09/2016  CLINICAL DATA:  35 year old female pregnant patient with abdominal and pelvic pain. EXAM: OBSTETRIC <14 WK US AND TRANSVAGINAL OB US TECHNIQUE: Both transabdominal and transvaginal ultrasound examinations were performed for complete evaluation of the gestation as well as the maternal uterus, adnexal regions, and pelvic cul-de-sac. Transvaginal technique was performed to assess early pregnancy. COMPARISON:  Pelvic ultrasound 09/06/2010. FINDINGS: Intrauterine gestational sac: Single ovoid shaped gestational sac in the fundal portion of the endometrial canal. Yolk sac:  None. Embryo:  None. Cardiac Activity: None. Heart Rate: N/A MSD: 3.1  mm   5 w   0  d       US EDC: N/A Subchorionic hemorrhage:  None visualized. Maternal uterus/adnexae: Degenerating corpus  luteum cyst in the left ovary incidentally noted. Right ovary is normal in appearance. No significant volume of free fluid in the cul-de-sac. IMPRESSION: 1. Probable early intrauterine gestational sac, but no yolk sac, fetal pole, or cardiac activity yet visualized. Recommend follow-up quantitative B-HCG levels and follow-up US in 14 days to confirm and assess viability.  This recommendation follows SRU consensus guidelines: Diagnostic Criteria for Nonviable Pregnancy Early in the First Trimester. Malva Limes Med 2013; 409:8119-14. 2. No acute findings. Electronically Signed   By: Trudie Reed M.D.   On: 01/09/2016 15:30   US Ob Transvaginal  01/19/2016  CLINICAL DATA:  Abdominal pain in pregnancy; assess fetal viability two months he had EXAM: TRANSVAGINAL OB ULTRASOUND TECHNIQUE: Transvaginal ultrasound was performed for complete evaluation of the gestation as well as the maternal uterus, adnexal regions, and pelvic cul-de-sac. COMPARISON:  Ob ultrasound of August 12, 2015 FINDINGS: Intrauterine gestational sac: Single Yolk sac:  Present Embryo:  Present Cardiac Activity: Present Heart Rate: 104 bpm CRL:   4  mm   6 w 1 d                  [place] [facility]: September 12, 2016 Subchorionic hemorrhage:  None visualized. Maternal uterus/adnexae: The right ovary is normal in appearance. There is a dominant left-sided ovarian cyst measuring 4.1 x 2.7 x 2.9 cm. It exhibits no abnormal vascularity. IMPRESSION: 1. Viable IUP with estimated gestational age of [redacted] weeks 1 day with estimated date of confinement of September 12, 2016 2. No subchorionic hemorrhage. 3. Stable appearing left ovarian cystic structure likely reflecting a persistent corpus luteum. Electronically Signed   By: David  [place] M.D.   On: 01/19/2016 15:23   US Ob Transvaginal  01/09/2016  CLINICAL DATA:  35 year old female pregnant patient with abdominal and pelvic pain. EXAM: OBSTETRIC <14 WK [place] AND TRANSVAGINAL OB US TECHNIQUE: Both transabdominal and transvaginal ultrasound examinations were performed for complete evaluation of the gestation as well as the maternal uterus, adnexal regions, and pelvic cul-de-sac. Transvaginal technique was performed to assess early pregnancy. COMPARISON:  Pelvic ultrasound 09/06/2010. FINDINGS: Intrauterine gestational sac: Single ovoid shaped gestational sac in the fundal portion of the endometrial  canal. Yolk sac:  None. Embryo:  None. Cardiac Activity: None. Heart Rate: N/A MSD: 3.1  mm   5 w   0  d       [place] EDC: N/A Subchorionic hemorrhage:  None visualized. Maternal uterus/adnexae: Degenerating corpus luteum cyst in the left ovary incidentally noted. Right ovary is normal in appearance. No significant volume of free fluid in the cul-de-sac. IMPRESSION: 1. Probable early intrauterine gestational sac, but no yolk sac, fetal pole, or cardiac activity yet visualized. Recommend follow-up quantitative B-HCG levels and follow-up US in 14 days to confirm and assess viability. This recommendation follows SRU consensus guidelines: Diagnostic Criteria for Nonviable Pregnancy Early in the First Trimester. Malva Limes Med 2013; 782:9562-13. 2. No acute findings. Electronically Signed   By: Trudie Reed M.D.   On: 01/09/2016 15:30    ASSESSMENT 1. Normal pregnancy, first trimester     PLAN Discharge home in stable condition Patient advised to continue taking prenatal vitamins Pregnancy confirmation letter given Patient advised to start prenatal care with [facility] provider of choice as soon as possible  Tereso Newcomer, MD  01/19/2016  4:19 PM

## 2016-01-19 NOTE — Patient Instructions (Signed)
  [facility]   [facility]     Phone: [phone]  [facility]  Phone: [phone]  [facility] for [facility] [place]  Phone: [phone]  [facility] [place]  Phone: [phone]  [facility]    Phone: [phone]   [facility] [place]    Phone: [phone]  [facility]       Phone: [phone]  [facility]    Phone: [phone]  [facility]    Phone: [phone]  [facility]    Phone: [phone]  [facility] Phone: [phone]   [facility]  Phone: [phone]  [facility]    Phone: [phone]  [facility]   Phone: [phone]  [facility]      Phone: [phone]  [facility]    Phone: [phone]  [facility]     Phone: [phone]

## 2016-01-20 ENCOUNTER — Ambulatory Visit (HOSPITAL_COMMUNITY): Payer: Self-pay

## 2016-11-13 ENCOUNTER — Encounter (HOSPITAL_COMMUNITY): Payer: Self-pay

## 2016-12-27 ENCOUNTER — Emergency Department (HOSPITAL_COMMUNITY)
Admission: EM | Admit: 2016-12-27 | Discharge: 2016-12-28 | Disposition: A | Discharge status: Home / Self Care | Payer: Medicaid Other | Attending: Emergency Medicine | Admitting: Emergency Medicine

## 2016-12-27 ENCOUNTER — Encounter (HOSPITAL_COMMUNITY): Payer: Self-pay | Admitting: Emergency Medicine

## 2016-12-27 ENCOUNTER — Emergency Department (HOSPITAL_COMMUNITY): Payer: Medicaid Other

## 2016-12-27 DIAGNOSIS — M25531 Pain in right wrist: Secondary | ICD-10-CM | POA: Insufficient documentation

## 2016-12-27 DIAGNOSIS — J45909 Unspecified asthma, uncomplicated: Secondary | ICD-10-CM | POA: Insufficient documentation

## 2016-12-27 DIAGNOSIS — Z87891 Personal history of nicotine dependence: Secondary | ICD-10-CM | POA: Insufficient documentation

## 2016-12-27 HISTORY — DX: Unspecified asthma, uncomplicated: J45.909

## 2016-12-27 MED ORDER — ACETAMINOPHEN 325 MG PO TABS
650.0000 mg | ORAL_TABLET | Freq: Once | ORAL | Status: AC
  Administered 2016-12-28: 650 mg via ORAL
  Filled 2016-12-27: qty 2

## 2016-12-27 MED ORDER — IBUPROFEN 200 MG PO TABS
600.0000 mg | ORAL_TABLET | Freq: Once | ORAL | Status: AC
  Administered 2016-12-28: 600 mg via ORAL
  Filled 2016-12-27: qty 3

## 2016-12-27 NOTE — ED Triage Notes (Signed)
Pt reports right hand wrist and forearm pain denies event or injury

## 2016-12-27 NOTE — ED Notes (Signed)
Pt refused ice

## 2016-12-27 NOTE — ED Provider Notes (Signed)
[facility]-EMERGENCY DEPT Provider Note   CSN: [id number] Arrival date & time: 12/27/16  1957  By signing my name below, I, [name], attest that this documentation has been prepared under the direction and in the presence of Azalia Bilisampos, Leonore Frankson, MD. Electronically Signed: [name], ED Scribe. 12/27/16. 11:56 PM.  History   Chief Complaint Chief Complaint  Patient presents with  . Arm Injury   The history is provided by the patient. No language interpreter was used.   HPI Comments: Kristina Santiago is a 36 y.o. female who presents to the Emergency Department complaining of constant, moderate right wrist pain onset 2 weeks ago when she woke up. No injury or trauma to the area. Pt's pain is better with flexion and exacerbated by extension. Pt reports associated numbness/tingling in her forearm. No treatments tried prior to arrival. 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 Pt denies neck pain or any other complaints at this time.  Past Medical History:  Diagnosis Date  . Asthma   . Medical history non-contributory     There are no active problems to display for this patient.   Past Surgical History:  Procedure Laterality Date  . WISDOM TOOTH EXTRACTION      OB History    Gravida Para Term Preterm AB Living   3 1 1   1 1    SAB TAB Ectopic Multiple Live Births   1       1       Home Medications    Prior to Admission medications   Not on File    Family History History reviewed. No pertinent family history.  Social History Social History  Substance Use Topics  . Smoking status: Former Games developermoker  . Smokeless tobacco: Never Used  . Alcohol use Yes     Allergies   Penicillins   Review of Systems Review of Systems All other systems reviewed and are negative for acute change except as noted in the HPI.  Physical Exam Updated Vital Signs BP (!) 155/108 (BP Location: Left Arm)   Pulse 95   Temp 97.9 F (36.6 C) (Oral)    Resp 18   Wt 146 lb (66.2 kg)   LMP 12/25/2016   SpO2 100%   Breastfeeding? No   BMI 28.51 kg/m   Physical Exam  Constitutional: She is oriented to person, place, and time. She appears well-developed and well-nourished.  HENT:  Head: Normocephalic.  Eyes: EOM are normal.  Neck: Normal range of motion.  Pulmonary/Chest: Effort normal.  Abdominal: She exhibits no distension.  Musculoskeletal:  Normal right radial pulse.  Full range of motion of right elbow and right wrist.  Normal extensor and flexor tendon function of her right hand.  Normal perfusion of all 5 fingertips.  No significant joint effusion noted in the right wrist.  Able to arrange right wrist.  No obvious deformity.  Overlying skin of the right wrist and right forearm and right hand is normal without erythema.  Neurological: She is alert and oriented to person, place, and time.  Psychiatric: She has a normal mood and affect.  Nursing note and vitals reviewed.    ED Treatments / Results  DIAGNOSTIC STUDIES: Oxygen Saturation is 100% on RA, normal by my interpretation.   COORDINATION OF CARE: 11:40 PM-Discussed next steps with pt including negative XR results and follow up with ortho. Pt verbalized understanding and is agreeable with the plan.   Labs (all labs ordered  are listed, but only abnormal results are displayed) Labs Reviewed - No data to display  EKG  EKG Interpretation None       Radiology Dg Wrist Complete Right  Result Date: 12/27/2016 CLINICAL DATA:  Acute right wrist pain. No known injury. Initial encounter. EXAM: RIGHT WRIST - COMPLETE 3+ VIEW COMPARISON:  None. FINDINGS: There is no evidence of fracture or dislocation. There is no evidence of arthropathy or other focal bone abnormality. Soft tissues are unremarkable. IMPRESSION: Negative. Electronically Signed   By: Harmon Pier M.D.   On: 12/27/2016 21:31    Procedures Procedures (including critical care time)  Medications Ordered in  ED Medications - No data to display   Initial Impression / Assessment and Plan / ED Course  I have reviewed the triage vital signs and the nursing notes.  Pertinent labs & imaging results that were available during my care of the patient were reviewed by me and considered in my medical decision making (see chart for details).      Patient X-Ray negative for obvious fracture or dislocation.  Pt advised to follow up with orthopedics. Patient given splint while in ED, conservative therapy recommended and discussed. Patient will be discharged home & is agreeable with above plan. Returns precautions discussed. Pt appears safe for discharge.  May represent tendinitis.  Recommended anti-inflammatories.  Final Clinical Impressions(s) / ED Diagnoses   Final diagnoses:  Acute pain of right wrist    New Prescriptions There are no discharge medications for this patient.  I personally performed the services described in this documentation, which was scribed in my presence. The recorded information has been reviewed and is accurate.        Azalia Bilis, MD 12/28/16 [phone]

## 2016-12-28 NOTE — Progress Notes (Signed)
Orthopedic Tech Progress Note Patient Details:  Kristina Santiago 02-19-1981 [id number]  Ortho Devices Type of Ortho Device: Arm sling, Thumb velcro splint Ortho Device/Splint Location: rue Ortho Device/Splint Interventions: Ordered, Application, Adjustment   Trinna PostMartinez, [name] J 12/28/2016, 12:30 AM

## 2016-12-28 NOTE — Discharge Instructions (Signed)
Where the splint during the day and night. You may remove this for showering.   Please wear until follow up with the orthopedic surgeon
